# Patient Record
Sex: Female | Born: 1965 | Hispanic: Yes | Marital: Married | Smoking: Never smoker
Health system: Southern US, Community
[De-identification: ages and names within clinical notes are randomized; demographics above are authoritative.]

---

## 1999-11-29 ENCOUNTER — Encounter: Payer: Self-pay | Admitting: *Deleted

## 1999-11-29 ENCOUNTER — Ambulatory Visit (HOSPITAL_COMMUNITY): Admission: RE | Admit: 1999-11-29 | Discharge: 1999-11-29 | Payer: Self-pay | Admitting: *Deleted

## 2001-02-12 ENCOUNTER — Encounter: Payer: Self-pay | Admitting: *Deleted

## 2001-02-12 ENCOUNTER — Ambulatory Visit (HOSPITAL_COMMUNITY): Admission: RE | Admit: 2001-02-12 | Discharge: 2001-02-12 | Payer: Self-pay | Admitting: *Deleted

## 2002-02-02 ENCOUNTER — Other Ambulatory Visit: Admission: RE | Admit: 2002-02-02 | Discharge: 2002-02-02 | Payer: Self-pay | Admitting: *Deleted

## 2002-02-18 ENCOUNTER — Encounter: Payer: Self-pay | Admitting: Obstetrics and Gynecology

## 2002-02-18 ENCOUNTER — Ambulatory Visit (HOSPITAL_COMMUNITY): Admission: RE | Admit: 2002-02-18 | Discharge: 2002-02-18 | Payer: Self-pay | Admitting: Obstetrics and Gynecology

## 2002-02-21 ENCOUNTER — Encounter: Admission: RE | Admit: 2002-02-21 | Discharge: 2002-02-21 | Payer: Self-pay | Admitting: Obstetrics and Gynecology

## 2002-02-21 ENCOUNTER — Encounter: Payer: Self-pay | Admitting: Obstetrics and Gynecology

## 2003-03-01 ENCOUNTER — Encounter: Admission: RE | Admit: 2003-03-01 | Discharge: 2003-03-01 | Payer: Self-pay | Admitting: Obstetrics and Gynecology

## 2003-03-01 ENCOUNTER — Encounter: Payer: Self-pay | Admitting: Obstetrics and Gynecology

## 2003-12-22 ENCOUNTER — Other Ambulatory Visit: Admission: RE | Admit: 2003-12-22 | Discharge: 2003-12-22 | Payer: Self-pay | Admitting: Obstetrics & Gynecology

## 2004-07-18 ENCOUNTER — Encounter: Admission: RE | Admit: 2004-07-18 | Discharge: 2004-07-18 | Payer: Self-pay | Admitting: Obstetrics and Gynecology

## 2005-09-03 ENCOUNTER — Encounter: Admission: RE | Admit: 2005-09-03 | Discharge: 2005-09-03 | Payer: Self-pay | Admitting: Obstetrics and Gynecology

## 2006-09-09 ENCOUNTER — Encounter: Admission: RE | Admit: 2006-09-09 | Discharge: 2006-09-09 | Payer: Self-pay | Admitting: Obstetrics and Gynecology

## 2007-09-13 ENCOUNTER — Encounter: Admission: RE | Admit: 2007-09-13 | Discharge: 2007-09-13 | Payer: Self-pay | Admitting: Obstetrics and Gynecology

## 2008-09-26 ENCOUNTER — Encounter: Admission: RE | Admit: 2008-09-26 | Discharge: 2008-09-26 | Payer: Self-pay | Admitting: Obstetrics and Gynecology

## 2009-09-27 ENCOUNTER — Encounter: Admission: RE | Admit: 2009-09-27 | Discharge: 2009-09-27 | Payer: Self-pay | Admitting: Obstetrics and Gynecology

## 2010-10-08 ENCOUNTER — Encounter: Admission: RE | Admit: 2010-10-08 | Discharge: 2010-10-08 | Payer: Self-pay | Admitting: Obstetrics and Gynecology

## 2015-07-09 ENCOUNTER — Ambulatory Visit
Admission: RE | Admit: 2015-07-09 | Discharge: 2015-07-09 | Disposition: A | Discharge status: Home / Self Care | Payer: PRIVATE HEALTH INSURANCE | Source: Ambulatory Visit | Attending: Infectious Disease | Admitting: Infectious Disease

## 2015-07-09 ENCOUNTER — Other Ambulatory Visit: Payer: Self-pay | Admitting: Infectious Disease

## 2015-07-09 DIAGNOSIS — R7611 Nonspecific reaction to tuberculin skin test without active tuberculosis: Secondary | ICD-10-CM

## 2015-07-19 ENCOUNTER — Ambulatory Visit
Admission: RE | Admit: 2015-07-19 | Discharge: 2015-07-19 | Disposition: A | Discharge status: Home / Self Care | Payer: No Typology Code available for payment source | Source: Ambulatory Visit | Attending: Infectious Disease | Admitting: Infectious Disease

## 2015-07-19 ENCOUNTER — Other Ambulatory Visit: Payer: Self-pay | Admitting: Infectious Disease

## 2015-07-19 DIAGNOSIS — Z111 Encounter for screening for respiratory tuberculosis: Secondary | ICD-10-CM

## 2015-07-23 ENCOUNTER — Other Ambulatory Visit: Payer: Self-pay | Admitting: Infectious Disease

## 2015-07-23 DIAGNOSIS — A159 Respiratory tuberculosis unspecified: Secondary | ICD-10-CM

## 2015-07-25 ENCOUNTER — Ambulatory Visit
Admission: RE | Admit: 2015-07-25 | Discharge: 2015-07-25 | Disposition: A | Discharge status: Home / Self Care | Payer: BLUE CROSS/BLUE SHIELD | Source: Ambulatory Visit | Attending: Infectious Disease | Admitting: Infectious Disease

## 2015-07-25 DIAGNOSIS — A159 Respiratory tuberculosis unspecified: Secondary | ICD-10-CM

## 2015-07-25 MED ORDER — IOPAMIDOL (ISOVUE-300) INJECTION 61%: 86.0000 mL | Freq: Once | INTRAVENOUS | Status: DC | PRN

## 2015-07-26 ENCOUNTER — Inpatient Hospital Stay: Admission: RE | Admit: 2015-07-26 | Payer: Self-pay | Source: Ambulatory Visit

## 2017-01-18 IMAGING — CR DG CHEST 1V
1 series · 1 of 1 positions shown · non-contrast
Comparison: 06/28/2015.

CLINICAL DATA: Positive PPD.

EXAM:
CHEST  1 VIEW

[w chest pa]
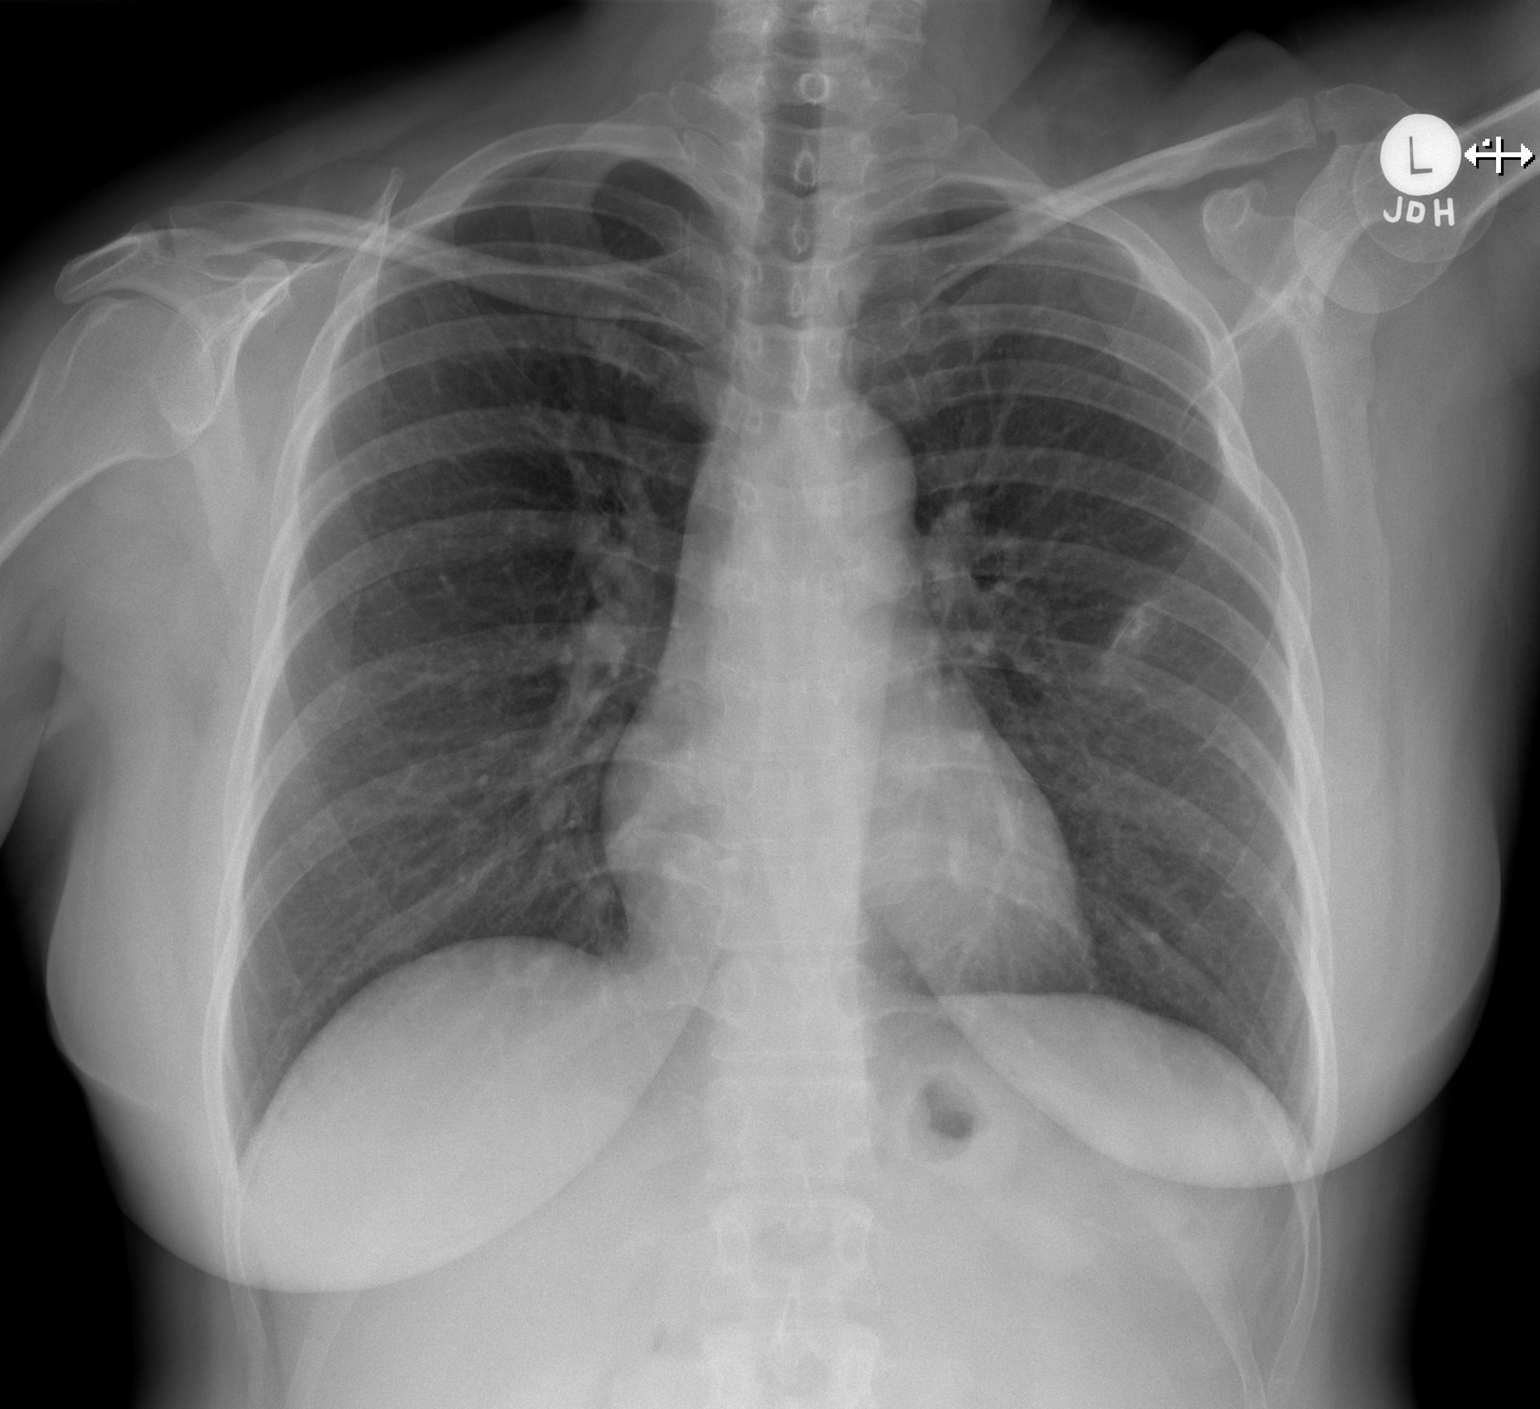

[1 of 1 positions shown; findings below may reference images not displayed]

FINDINGS: Mediastinum and hilar structures normal. Persistent ill-defined
density noted left mid lung. No significant clearing. This could
represent persistent infection including granulomatous infection,
pleural parenchymal scarring, or pulmonary mass lesion.
Contrast-enhanced chest CT is suggested to further evaluate. No
pleural effusion or pneumothorax.
IMPRESSION: Persistent density in the left mid lung field. This could represent
persistent infection including granulomatous infection,
pleural-parenchymal scarring, or pulmonary mass lesion.
Contrast-enhanced chest CT is suggested for further evaluation.

## 2017-10-28 DIAGNOSIS — Z01419 Encounter for gynecological examination (general) (routine) without abnormal findings: Secondary | ICD-10-CM | POA: Diagnosis not present

## 2017-10-28 DIAGNOSIS — Z1151 Encounter for screening for human papillomavirus (HPV): Secondary | ICD-10-CM | POA: Diagnosis not present

## 2017-10-28 DIAGNOSIS — Z1231 Encounter for screening mammogram for malignant neoplasm of breast: Secondary | ICD-10-CM | POA: Diagnosis not present

## 2017-10-28 DIAGNOSIS — Z6828 Body mass index (BMI) 28.0-28.9, adult: Secondary | ICD-10-CM | POA: Diagnosis not present

## 2018-05-04 DIAGNOSIS — Z0000 Encounter for general adult medical examination without abnormal findings: Secondary | ICD-10-CM | POA: Diagnosis not present

## 2018-05-04 DIAGNOSIS — E7800 Pure hypercholesterolemia, unspecified: Secondary | ICD-10-CM | POA: Diagnosis not present

## 2018-08-10 DIAGNOSIS — Z1211 Encounter for screening for malignant neoplasm of colon: Secondary | ICD-10-CM | POA: Diagnosis not present

## 2018-08-18 DIAGNOSIS — E7800 Pure hypercholesterolemia, unspecified: Secondary | ICD-10-CM | POA: Diagnosis not present

## 2018-09-06 DIAGNOSIS — Z23 Encounter for immunization: Secondary | ICD-10-CM | POA: Diagnosis not present

## 2018-12-29 DIAGNOSIS — L438 Other lichen planus: Secondary | ICD-10-CM | POA: Diagnosis not present

## 2018-12-29 DIAGNOSIS — L821 Other seborrheic keratosis: Secondary | ICD-10-CM | POA: Diagnosis not present

## 2019-01-11 DIAGNOSIS — Z6827 Body mass index (BMI) 27.0-27.9, adult: Secondary | ICD-10-CM | POA: Diagnosis not present

## 2019-01-11 DIAGNOSIS — Z01419 Encounter for gynecological examination (general) (routine) without abnormal findings: Secondary | ICD-10-CM | POA: Diagnosis not present

## 2019-01-11 DIAGNOSIS — Z1231 Encounter for screening mammogram for malignant neoplasm of breast: Secondary | ICD-10-CM | POA: Diagnosis not present

## 2019-01-18 DIAGNOSIS — E7800 Pure hypercholesterolemia, unspecified: Secondary | ICD-10-CM | POA: Diagnosis not present

## 2019-06-07 DIAGNOSIS — R030 Elevated blood-pressure reading, without diagnosis of hypertension: Secondary | ICD-10-CM | POA: Diagnosis not present

## 2019-07-14 DIAGNOSIS — E7800 Pure hypercholesterolemia, unspecified: Secondary | ICD-10-CM | POA: Diagnosis not present

## 2019-07-14 DIAGNOSIS — I10 Essential (primary) hypertension: Secondary | ICD-10-CM | POA: Diagnosis not present

## 2019-08-09 ENCOUNTER — Other Ambulatory Visit: Payer: Self-pay | Admitting: Registered"

## 2019-08-09 DIAGNOSIS — Z20822 Contact with and (suspected) exposure to covid-19: Secondary | ICD-10-CM

## 2019-08-09 DIAGNOSIS — R6889 Other general symptoms and signs: Secondary | ICD-10-CM | POA: Diagnosis not present

## 2019-08-11 LAB — NOVEL CORONAVIRUS, NAA: SARS-CoV-2, NAA: NOT DETECTED

## 2019-11-01 DIAGNOSIS — Z23 Encounter for immunization: Secondary | ICD-10-CM | POA: Diagnosis not present

## 2020-03-22 ENCOUNTER — Other Ambulatory Visit: Payer: Self-pay | Admitting: Obstetrics and Gynecology

## 2020-03-22 DIAGNOSIS — E2839 Other primary ovarian failure: Secondary | ICD-10-CM

## 2020-05-08 ENCOUNTER — Ambulatory Visit
Admission: RE | Admit: 2020-05-08 | Discharge: 2020-05-08 | Disposition: A | Discharge status: Home / Self Care | Payer: No Typology Code available for payment source | Source: Ambulatory Visit | Attending: Obstetrics and Gynecology | Admitting: Obstetrics and Gynecology

## 2020-05-08 ENCOUNTER — Other Ambulatory Visit: Payer: Self-pay

## 2020-05-08 DIAGNOSIS — E2839 Other primary ovarian failure: Secondary | ICD-10-CM

## 2020-10-31 ENCOUNTER — Ambulatory Visit: Payer: No Typology Code available for payment source

## 2022-08-28 ENCOUNTER — Ambulatory Visit
Admission: RE | Admit: 2022-08-28 | Discharge: 2022-08-28 | Disposition: A | Discharge status: Home / Self Care | Payer: Self-pay | Source: Ambulatory Visit | Attending: Neurological Surgery | Admitting: Neurological Surgery

## 2022-08-28 ENCOUNTER — Other Ambulatory Visit: Payer: Self-pay | Admitting: Radiation Therapy

## 2022-08-28 DIAGNOSIS — D329 Benign neoplasm of meninges, unspecified: Secondary | ICD-10-CM

## 2022-09-01 ENCOUNTER — Other Ambulatory Visit: Payer: Self-pay | Admitting: Radiation Therapy

## 2022-09-01 ENCOUNTER — Inpatient Hospital Stay: Payer: No Typology Code available for payment source | Attending: Neurological Surgery

## 2022-09-01 DIAGNOSIS — D329 Benign neoplasm of meninges, unspecified: Secondary | ICD-10-CM

## 2022-09-02 NOTE — Progress Notes (Addendum)
Location/Histology of Brain Tumor:  MRI Head w/ & w/o Contrast 01/30/2022 --IMPRESSION:  No definite acute intracranial abnormality appreciated.  There is a small homogeneously enhancing extra-axial dural based mass abutting the posterior aspect of the cavernous sinus on the right (measuring approximately 11 x 12 mm). No significant mass effect on the adjacent structures. Imaging appearance and location certainly suggestive of a meningioma, although, a malignant process unfortunately cannot be excluded. Recommend neurosurgical consultation   Patient presented with symptoms of:  sudden and intense headache this past February.   Past or anticipated interventions, if any, per neurosurgery:  08/08/2022 --Dr. Emelda Brothers (office visit)   Past or anticipated interventions, if any, per medical oncology:  No referral at this time  Dose of Decadron, if applicable: Not currently prescribed  Recent neurologic symptoms, if any:  Seizures: None Headaches: Reports occasional; generally towards the front of her head; occasionally takes OTC pain medication if swimmy head sensation lingers Nausea:  Denies Dizziness/ataxia Denies Difficulty with hand coordination: Denies  Focal numbness/weakness: Denies any new concerns (mild numbness to her right index finger from a previous cut, and occasional numbness to her thumbs from arthritis) Visual deficits/changes: Denies Confusion/Memory deficits: Denies  SAFETY ISSUES: Prior radiation? No Pacemaker/ICD? No Possible current pregnancy? No--perimenopausal and on oral birth control Is the patient on methotrexate? No  Additional Complaints / other details: 3T-MRI scheduled for 09/10/2022

## 2022-09-04 NOTE — Progress Notes (Signed)
Radiation Oncology         [phone] ________________________________  Initial Outpatient Consultation  Name: Janice Burns MRN: [id number]  Date: 09/05/2022  DOB: Oct 03, 1966  XQ:JJHERDEY, L.Marlou Sa, MD  Judith Part, MD   REFERRING PHYSICIAN: Judith Part, MD  DIAGNOSIS:    ICD-10-CM   1. Meningioma, cerebral (HCC)  D32.0 diazepam (VALIUM) 5 MG tablet    LORazepam (ATIVAN) 1 MG tablet    2. Benign neoplasm of cerebral meninges (HCC)  D32.0       Right sided cavernous sinus meningioma     CHIEF COMPLAINT: Here to discuss management of meningioma  HISTORY OF PRESENT ILLNESS::Janice Burns is a 56 y.o. female who presents today for consideration of radiosurgery in management of her recently diagnosed brain mass / meningioma.   The patient presented earlier this year for an MRI of the brain on 01/30/22 for evaluation of headaches. MRI revealed a small homogeneously enhancing extra-axial dural based mass abutting the posterior aspect of the cavernous sinus on the right, measuring approximately 11 x 12 mm. No significant mass effect on the adjacent structures was appreciated. Overall, the appearance and location of the mass were suggestive of a meningioma, although, a malignant process could not be entirely excluded.   Follow-up MRI of the brain on 08/12/22 showed a slight increase in size of the right clinoidal enhancing mass consistent with meningioma, measuring approximately 12 x 12 x 13 mm.   Accordingly, the patient followed up with Dr. Zada Finders on 08/28/22 to discuss MRI findings. Given a slight increase in size and the tumor's location, Dr. Zada Finders recommends treating the mass with radiosurgery, which we will discuss in detail today.      Recent neurologic symptoms, if any:  Seizures: None Headaches: Reports occasional; generally towards the front of her head; occasionally takes OTC pain medication if swimmy head sensation lingers Nausea:   Denies Dizziness/ataxia Denies Difficulty with hand coordination: Denies  Focal numbness/weakness: Denies any new concerns (mild numbness to her right index finger from a previous cut, and occasional numbness to her thumbs from arthritis) Visual deficits/changes: Denies Confusion/Memory deficits: [place]   Here with her husband and he appears to be very supportive.  She denies prior radiation.   She denies current pregnancy.     PREVIOUS RADIATION THERAPY: No  PAST MEDICAL HISTORY:  has no past medical history on file.    PAST SURGICAL HISTORY:History reviewed. No pertinent surgical history.  FAMILY HISTORY: family history includes Vaginal cancer in her maternal grandmother.  SOCIAL HISTORY:  reports that she has never smoked. She has never used smokeless tobacco. She reports current alcohol use.  ALLERGIES: Patient has no known allergies.  MEDICATIONS:  Current Outpatient Medications  Medication Sig Dispense Refill   atorvastatin (LIPITOR) 20 MG tablet Take 20 mg by mouth daily.     diazepam (VALIUM) 5 MG tablet Take 1 tablet 60 minutes prior to MRI; if needed, take a second tablet 30 minutes prior to MRI for claustrophobia. 4 tablet 0   estradiol-norethindrone (ACTIVELLA) 1-0.5 MG tablet Take 1 tablet by mouth daily.     LORazepam (ATIVAN) 1 MG tablet Take 1 tablet 40 minutes prior to radiation oncology procedures; if needed, take a second tablet 20 minutes prior for anxiety/claustrophobia. 8 tablet 0   metoprolol succinate (TOPROL-XL) 50 MG 24 hr tablet Take 50 mg by mouth daily.     No current facility-administered medications for this encounter.    REVIEW OF SYSTEMS:  Notable for that above.  PHYSICAL EXAM:  weight is 160 lb 8 oz (72.8 kg). Her oral temperature is 98.5 F (36.9 C). Her blood pressure is 147/84 (abnormal) and her pulse is 67. Her respiration is 16 and oxygen saturation is 100%.   General: Alert and oriented, in no acute distress  HEENT: Head is  normocephalic. Extraocular movements are intact. Oropharynx is clear.  Visual quadrants are intact Heart: Regular in rate and rhythm with no murmurs, rubs, or gallops. Chest: Clear to auscultation bilaterally, with no rhonchi, wheezes, or rales. Extremities: No cyanosis or edema. Lymphatics: see Neck Exam Skin: No concerning lesions. Musculoskeletal: symmetric strength and muscle tone throughout. Neurologic: Cranial nerves II through XII are grossly intact. No obvious focalities. Speech is fluent. Coordination is intact. Psychiatric: Judgment and insight are intact. Affect is appropriate.  KPS = 100  100 - Normal; no complaints; no evidence of disease. 90   - Able to carry on normal activity; minor signs or symptoms of disease. 80   - Normal activity with effort; some signs or symptoms of disease. 28   - Cares for self; unable to carry on normal activity or to do active work. 60   - Requires occasional assistance, but is able to care for most of his personal needs. 50   - Requires considerable assistance and frequent medical care. 24   - Disabled; requires special care and assistance. 53   - Severely disabled; hospital admission is indicated although death not imminent. 75   - Very sick; hospital admission necessary; active supportive treatment necessary. 10   - Moribund; fatal processes progressing rapidly. 0     - Dead  Karnofsky DA, Abelmann Paulina, Craver LS and Burchenal JH [phone]) The use of the nitrogen mustards in the palliative treatment of carcinoma: with particular reference to bronchogenic carcinoma Cancer 1 634-56    LABORATORY DATA:  No results found for: "WBC", "HGB", "HCT", "MCV", "PLT" CMP     Component Value Date/Time   BUN 14 09/05/2022 0858   CREATININE 0.69 09/05/2022 0858   GFRNONAA >60 09/05/2022 0858         RADIOGRAPHY: I personally reviewed her imaging as above      IMPRESSION/PLAN: This is a  lovely 56 year old woman with a mass in her brain consistent  with a benign meningioma; she has seen neurosurgery and surgical resection is not recommended.  We discussed her at our brain tumor conference and the consensus was that she should see me for an opinion regarding radiosurgery for putative benign meningioma.  Given the location of the meningioma and the slight growth, I recommend radiosurgery to prevent it from continuing to grow and to protect her neurologic function.  I recommend that she  be treated with fractionated radiosurgery to minimize the risk of injury to cranial nerves/optic chiasm and surrounding normal tissues.  Recommended dose of 25 Gray in 5 fractions.  We did discuss single fraction radiation therapy which is slightly more convenient but may have slightly higher risks.  She would like to pursue the 5 fraction regimen.  She will undergo treatment planning in the near future as well as a 3 Tesla MRI.  We will start her radiation before the end of the month and finish her radiation by the beginning of November  The risk benefits and side effects of radiation therapy in the form of radiosurgery were discussed in detail.  No guarantees of treatment were given.  Side effects may include but not necessarily be limited to headache, dizziness, seizure,  nausea, injury to brain, injury to cranial nerves, vision loss.  Consent form was signed and placed in her chart.  She is enthusiastic about proceeding with treatment.  Benzodiazepine prescriptions provided as she is very claustrophobic; instructions given on how to take these medications for MRIs and radiation procedures (which will require a stereotactic mask).  On date of service, in total, I spent 60 minutes on this encounter. Patient was seen in person.   __________________________________________   Eppie Gibson, MD  This document serves as a record of services personally performed by Eppie Gibson, MD. It was created on her behalf by Roney Mans, a trained medical scribe. The creation  of this record is based on the scribe's personal observations and the provider's statements to them. This document has been checked and approved by the attending provider.

## 2022-09-05 ENCOUNTER — Ambulatory Visit
Admission: RE | Admit: 2022-09-05 | Discharge: 2022-09-05 | Disposition: A | Discharge status: Home / Self Care | Payer: No Typology Code available for payment source | Source: Ambulatory Visit | Attending: Radiation Oncology | Admitting: Radiation Oncology

## 2022-09-05 ENCOUNTER — Other Ambulatory Visit: Payer: Self-pay

## 2022-09-05 ENCOUNTER — Encounter: Payer: Self-pay | Admitting: Radiation Oncology

## 2022-09-05 ENCOUNTER — Telehealth: Payer: Self-pay | Admitting: Radiation Therapy

## 2022-09-05 VITALS — BP 147/84 | HR 67 | Temp 98.5°F | Resp 16 | Wt 160.5 lb

## 2022-09-05 DIAGNOSIS — D320 Benign neoplasm of cerebral meninges: Secondary | ICD-10-CM | POA: Insufficient documentation

## 2022-09-05 DIAGNOSIS — Z79899 Other long term (current) drug therapy: Secondary | ICD-10-CM | POA: Insufficient documentation

## 2022-09-05 DIAGNOSIS — D329 Benign neoplasm of meninges, unspecified: Secondary | ICD-10-CM

## 2022-09-05 DIAGNOSIS — Z808 Family history of malignant neoplasm of other organs or systems: Secondary | ICD-10-CM | POA: Insufficient documentation

## 2022-09-05 LAB — BUN & CREATININE (CHCC)
BUN: 14 mg/dL (ref 6–20)
Creatinine: 0.69 mg/dL (ref 0.44–1.00)
GFR, Estimated: >60 mL/min (ref >60)

## 2022-09-05 MED ORDER — LORAZEPAM 1 MG PO TABS: ORAL_TABLET | ORAL | 0 refills | Status: DC

## 2022-09-05 MED ORDER — DIAZEPAM 5 MG PO TABS: ORAL_TABLET | ORAL | 0 refills | Status: AC

## 2022-09-05 NOTE — Telephone Encounter (Signed)
Called pt to let her know we were planning to do a urine pregnancy test the day of sim, but pt confirmed she is postmenopausal, last period at the age of 65.   Mont Dutton R.T.(R)(T) Radiation Special Procedures Navigator

## 2022-09-10 ENCOUNTER — Ambulatory Visit
Admission: RE | Admit: 2022-09-10 | Discharge: 2022-09-10 | Disposition: A | Discharge status: Home / Self Care | Payer: No Typology Code available for payment source | Source: Ambulatory Visit | Attending: Radiation Oncology | Admitting: Radiation Oncology

## 2022-09-10 DIAGNOSIS — D329 Benign neoplasm of meninges, unspecified: Secondary | ICD-10-CM

## 2022-09-10 MED ORDER — GADOPICLENOL 0.5 MMOL/ML IV SOLN
7.5000 mL | Freq: Once | INTRAVENOUS | Status: AC | PRN
  Administered 2022-09-10: 7.5 mL via INTRAVENOUS

## 2022-09-12 ENCOUNTER — Ambulatory Visit: Payer: No Typology Code available for payment source | Admitting: Radiation Oncology

## 2022-09-12 ENCOUNTER — Other Ambulatory Visit: Payer: Self-pay | Admitting: Radiation Therapy

## 2022-09-12 ENCOUNTER — Ambulatory Visit: Payer: No Typology Code available for payment source

## 2022-09-15 ENCOUNTER — Ambulatory Visit
Admission: RE | Admit: 2022-09-15 | Discharge: 2022-09-15 | Disposition: A | Discharge status: Home / Self Care | Payer: No Typology Code available for payment source | Source: Ambulatory Visit | Attending: Radiation Oncology | Admitting: Radiation Oncology

## 2022-09-15 ENCOUNTER — Other Ambulatory Visit: Payer: Self-pay

## 2022-09-15 VITALS — BP 147/87 | HR 76 | Temp 97.9°F | Resp 18 | Ht 64.0 in | Wt 161.8 lb

## 2022-09-15 DIAGNOSIS — D320 Benign neoplasm of cerebral meninges: Secondary | ICD-10-CM | POA: Diagnosis not present

## 2022-09-15 MED ORDER — SODIUM CHLORIDE 0.9% FLUSH: 10.0000 mL | INTRAVENOUS | Status: DC | PRN

## 2022-09-15 MED ORDER — SODIUM CHLORIDE 0.9% FLUSH
10.0000 mL | INTRAVENOUS | Status: DC | PRN
  Administered 2022-09-15: 10 mL via INTRAVENOUS

## 2022-09-17 ENCOUNTER — Ambulatory Visit: Payer: No Typology Code available for payment source | Admitting: Radiation Oncology

## 2022-09-17 DIAGNOSIS — D320 Benign neoplasm of cerebral meninges: Secondary | ICD-10-CM | POA: Diagnosis not present

## 2022-09-22 ENCOUNTER — Ambulatory Visit
Admission: RE | Admit: 2022-09-22 | Discharge: 2022-09-22 | Disposition: A | Discharge status: Home / Self Care | Payer: No Typology Code available for payment source | Source: Ambulatory Visit | Attending: Radiation Oncology | Admitting: Radiation Oncology

## 2022-09-22 ENCOUNTER — Other Ambulatory Visit: Payer: Self-pay

## 2022-09-22 DIAGNOSIS — D320 Benign neoplasm of cerebral meninges: Secondary | ICD-10-CM | POA: Diagnosis not present

## 2022-09-22 LAB — RAD ONC ARIA SESSION SUMMARY
Course Elapsed Days: 0
Plan Fractions Treated to Date: 1
Plan Prescribed Dose Per Fraction: 5 Gy
Plan Total Fractions Prescribed: 5
Plan Total Prescribed Dose: 25 Gy
Reference Point Dosage Given to Date: 5 Gy
Reference Point Session Dosage Given: 5 Gy
Session Number: 1

## 2022-09-22 NOTE — Progress Notes (Signed)
Patient to nursing for observation [facility].  Denies headache, visual changes, ringing in ears, nausea, and skin irritation.  Report feeling sleepy.  Speech clear.  Ambulated out of clinic independently hand holding with spouse.  Advised not to do anything strenuous for 24 hours and to call [phone] if have questions.  Vitals:  98.1-65-114/60 O2 sat 98% room air.

## 2022-09-22 NOTE — Op Note (Signed)
  Name: Janice Burns  MRN: [id number]  Date: 09/22/2022   DOB: 02-May-1966  Stereotactic Radiosurgery Operative Note  PRE-OPERATIVE DIAGNOSIS:  Cavernous sinus meningioma  POST-OPERATIVE DIAGNOSIS:  Same  PROCEDURE:  Stereotactic Radiosurgery  SURGEON:  Judith Part, MD  NARRATIVE: The patient underwent a radiation treatment planning session in the radiation oncology simulation suite under the care of the radiation oncology physician and physicist.  I participated closely in the radiation treatment planning afterwards. The patient underwent planning CT which was fused to 3T high resolution MRI with 1 mm axial slices.  These images were fused on the planning system.  We contoured the gross target volumes and subsequently expanded this to yield the Planning Target Volume. I actively participated in the planning process.  I helped to define and review the target contours and also the contours of the optic pathway, eyes, brainstem and selected nearby organs at risk.  All the dose constraints for critical structures were reviewed and compared to AAPM Task Group 101.  The prescription dose conformity was reviewed.  I approved the plan electronically.    Accordingly, Janice Burns was brought to the TrueBeam stereotactic radiation treatment linac and placed in the custom immobilization mask.  The patient was aligned according to the IR fiducial markers with BrainLab Exactrac, then orthogonal x-rays were used in ExacTrac with the 6DOF robotic table and the shifts were made to align the patient  Janice Burns received stereotactic radiosurgery uneventfully.    Lesions treated:  1   Complex lesions treated:  1 (>3.5 cm, <32m of optic path, or within the brainstem)   The detailed description of the procedure is recorded in the radiation oncology procedure note.  I was present for the duration of the procedure.  DISPOSITION:  Following delivery, the patient was transported to nursing in stable condition  and monitored for possible acute effects to be discharged to home in stable condition with follow-up in one month.  TJudith Part MD 09/22/2022 5:17 PM

## 2022-09-24 ENCOUNTER — Other Ambulatory Visit: Payer: Self-pay

## 2022-09-24 ENCOUNTER — Ambulatory Visit
Admission: RE | Admit: 2022-09-24 | Discharge: 2022-09-24 | Disposition: A | Discharge status: Home / Self Care | Payer: No Typology Code available for payment source | Source: Ambulatory Visit | Attending: Radiation Oncology | Admitting: Radiation Oncology

## 2022-09-24 DIAGNOSIS — D320 Benign neoplasm of cerebral meninges: Secondary | ICD-10-CM | POA: Diagnosis not present

## 2022-09-24 LAB — RAD ONC ARIA SESSION SUMMARY
Course Elapsed Days: 2
Plan Fractions Treated to Date: 2
Plan Prescribed Dose Per Fraction: 5 Gy
Plan Total Fractions Prescribed: 5
Plan Total Prescribed Dose: 25 Gy
Reference Point Dosage Given to Date: 10 Gy
Reference Point Session Dosage Given: 5 Gy
Session Number: 2

## 2022-09-24 NOTE — Progress Notes (Signed)
Nurse monitoring complete status post 2 of 5 SRS treatments. Patient without complaints. Patient denies new or worsening neurologic symptoms. Vitals stable. Instructed patient to avoid strenuous activity for the next 24 hours. Instructed patient to call [phone] with needs related to treatment after hours or over the weekend. Patient verbalized understanding. Patient ambulated out of clinic unassisted accompanied by her husband without incident  Vitals:   09/24/22 1504  BP: (!) 147/81  Pulse: 65  Resp: 18  Temp: 97.7 F (36.5 C)  SpO2: 100%

## 2022-09-26 ENCOUNTER — Other Ambulatory Visit: Payer: Self-pay

## 2022-09-26 ENCOUNTER — Ambulatory Visit
Admission: RE | Admit: 2022-09-26 | Discharge: 2022-09-26 | Disposition: A | Discharge status: Home / Self Care | Payer: No Typology Code available for payment source | Source: Ambulatory Visit | Attending: Radiation Oncology | Admitting: Radiation Oncology

## 2022-09-26 DIAGNOSIS — D320 Benign neoplasm of cerebral meninges: Secondary | ICD-10-CM | POA: Diagnosis not present

## 2022-09-26 LAB — RAD ONC ARIA SESSION SUMMARY
Course Elapsed Days: 4
Plan Fractions Treated to Date: 3
Plan Prescribed Dose Per Fraction: 5 Gy
Plan Total Fractions Prescribed: 5
Plan Total Prescribed Dose: 25 Gy
Reference Point Dosage Given to Date: 15 Gy
Reference Point Session Dosage Given: 5 Gy
Session Number: 3

## 2022-09-26 NOTE — Progress Notes (Signed)
Nurse monitoring complete status post SRS treatments. Pt was monitored for 15 minutes.  Patient without complaints. Patient denies new or worsening neurologic symptoms. Vitals stable. Instructed patient to avoid strenuous activity for the next 24 hours.. Instructed patient to call [phone] with needs related to treatment after hours or over the weekend. Patient verbalized understanding  Vitals:   09/26/22 1524  BP: 122/75  Pulse: 68  Resp: 18  Temp: (!) 97.5 F (36.4 C)  SpO2: 100%   Pt ambulated out of clinic without difficulty. Pt stable at time of leaving radiation oncology department.

## 2022-09-29 ENCOUNTER — Other Ambulatory Visit: Payer: Self-pay

## 2022-09-29 ENCOUNTER — Ambulatory Visit
Admission: RE | Admit: 2022-09-29 | Discharge: 2022-09-29 | Disposition: A | Discharge status: Home / Self Care | Payer: No Typology Code available for payment source | Source: Ambulatory Visit | Attending: Radiation Oncology | Admitting: Radiation Oncology

## 2022-09-29 VITALS — BP 143/87 | HR 69 | Temp 98.1°F | Resp 18

## 2022-09-29 DIAGNOSIS — D320 Benign neoplasm of cerebral meninges: Secondary | ICD-10-CM | POA: Diagnosis not present

## 2022-09-29 LAB — RAD ONC ARIA SESSION SUMMARY
Course Elapsed Days: 7
Plan Fractions Treated to Date: 4
Plan Prescribed Dose Per Fraction: 5 Gy
Plan Total Fractions Prescribed: 5
Plan Total Prescribed Dose: 25 Gy
Reference Point Dosage Given to Date: 20 Gy
Reference Point Session Dosage Given: 5 Gy
Session Number: 4

## 2022-09-29 NOTE — Progress Notes (Addendum)
Nurse monitoring complete status post SRS treatments. Patient without complaints. Patient denies new or worsening neurologic symptoms. Vitals stable. Instructed patient to avoid strenuous activity for the next 24 hours.  Instructed patient to call [phone] with needs related to treatment after hours or over the weekend. Patient verbalized understanding   Janice Burns rested with [place] for 15 minutes following SRS treatment.  Patient denies headache, dizziness, nausea, diplopia or ringing in the ears. Denies fatigue. Patient without complaints. Understands to avoid strenuous activity for the next 24 hours and call [phone] with needs.   Vitals:   09/29/22 1529  BP: (!) 143/87  Pulse: 69  Resp: 18  Temp: 98.1 F (36.7 C)  SpO2: 100%    Pt ambulated out of clinic without complication. Pt stable at time of leaving clinic.

## 2022-10-01 ENCOUNTER — Encounter: Payer: Self-pay | Admitting: Radiation Oncology

## 2022-10-01 ENCOUNTER — Ambulatory Visit
Admission: RE | Admit: 2022-10-01 | Discharge: 2022-10-01 | Disposition: A | Discharge status: Home / Self Care | Payer: No Typology Code available for payment source | Source: Ambulatory Visit | Attending: Radiation Oncology | Admitting: Radiation Oncology

## 2022-10-01 ENCOUNTER — Other Ambulatory Visit: Payer: Self-pay

## 2022-10-01 DIAGNOSIS — Z808 Family history of malignant neoplasm of other organs or systems: Secondary | ICD-10-CM | POA: Insufficient documentation

## 2022-10-01 DIAGNOSIS — D320 Benign neoplasm of cerebral meninges: Secondary | ICD-10-CM | POA: Insufficient documentation

## 2022-10-01 DIAGNOSIS — Z79899 Other long term (current) drug therapy: Secondary | ICD-10-CM | POA: Diagnosis not present

## 2022-10-01 LAB — RAD ONC ARIA SESSION SUMMARY
Course Elapsed Days: 9
Plan Fractions Treated to Date: 5
Plan Prescribed Dose Per Fraction: 5 Gy
Plan Total Fractions Prescribed: 5
Plan Total Prescribed Dose: 25 Gy
Reference Point Dosage Given to Date: 25 Gy
Reference Point Session Dosage Given: 5 Gy
Session Number: 5

## 2022-10-01 NOTE — Progress Notes (Signed)
Nurse monitoring complete status post SRS treatments. Patient without complaints. Patient denies new or worsening neurologic symptoms. Vitals stable. Instructed patient to avoid strenuous activity for the next 24 hours. Instructed patient to call [phone] with needs related to treatment after hours or over the weekend. Patient verbalized understanding Pt ambulated out of clinic without difficulty with family at bedside/  Vitals:   10/01/22 1327  BP: (!) 135/90  Pulse: 77  Resp: 18  Temp: 97.9 F (36.6 C)  SpO2: 100%

## 2022-10-21 NOTE — Progress Notes (Signed)
                                                                                                                                                            Patient Name: Janice Burns MRN: [id number] DOB: 10-06-1966 Referring Physician: Allison Quarry (Profile Not Attached) Date of Service: 10/01/2022 [facility], [place]                                                        End Of Treatment Note  Diagnoses: D32.0-Benign neoplasm of cerebral meninges Right sided cavernous sinus meningioma   Intent: Curative  Radiation Treatment Dates: 09/22/2022 through 10/01/2022 Site Technique Total Dose (Gy) Dose per Fx (Gy) Completed Fx Beam Energies  Brain: Brain_R_Clino IMRT 25/25 5 5/5 6XFFF   Narrative: The patient tolerated radiation therapy relatively well.   Plan: The patient will follow-up with radiation oncology in 44mo -----------------------------------  SEppie Gibson MD

## 2022-10-22 NOTE — Progress Notes (Signed)
Ms. Wentling is here for follow up for [facility] treatment. She completed treatment on 10-01-22.   Recent neurologic symptoms, if any:  Seizures: None Headaches: Denies Nausea: Denies Wt Readings from Last 3 Encounters:  10/31/22 160 lb 8 oz (72.8 kg)  09/15/22 161 lb 12.8 oz (73.4 kg)  09/05/22 160 lb 8 oz (72.8 kg)   Dizziness/ataxia: Denies Difficulty with hand coordination: Denies Focal numbness/weakness: Denies Visual deficits/changes: Denies Confusion/Memory deficits: Denies  Other issues of note: Denies any linger fatigue. Overall reports she feels good and is doing well

## 2022-10-31 ENCOUNTER — Other Ambulatory Visit: Payer: Self-pay

## 2022-10-31 ENCOUNTER — Encounter: Payer: Self-pay | Admitting: Radiation Oncology

## 2022-10-31 ENCOUNTER — Ambulatory Visit
Admission: RE | Admit: 2022-10-31 | Discharge: 2022-10-31 | Disposition: A | Discharge status: Home / Self Care | Payer: No Typology Code available for payment source | Source: Ambulatory Visit | Attending: Radiation Oncology | Admitting: Radiation Oncology

## 2022-10-31 VITALS — BP 137/81 | HR 78 | Temp 97.9°F | Resp 18 | Ht 64.0 in | Wt 160.5 lb

## 2022-10-31 DIAGNOSIS — Z923 Personal history of irradiation: Secondary | ICD-10-CM | POA: Diagnosis not present

## 2022-10-31 DIAGNOSIS — D320 Benign neoplasm of cerebral meninges: Secondary | ICD-10-CM

## 2022-10-31 DIAGNOSIS — Z79899 Other long term (current) drug therapy: Secondary | ICD-10-CM | POA: Insufficient documentation

## 2022-10-31 DIAGNOSIS — C320 Malignant neoplasm of glottis: Secondary | ICD-10-CM | POA: Insufficient documentation

## 2022-10-31 NOTE — Progress Notes (Signed)
Radiation Oncology         [phone] ________________________________  Name: Janice Burns MRN: [id number]  Date: 10/31/2022  DOB: Nov 08, 1966  Follow-Up Visit Note  Outpatient  CC: Alroy Dust, L.Marlou Sa, MD  Alroy Dust, L.Marlou Sa, MD  Diagnosis and Prior Radiotherapy:    ICD-10-CM   1. Benign neoplasm of cerebral meninges (HCC)  D32.0       CHIEF COMPLAINT: Here for follow-up and surveillance of meningioma   Narrative:  The patient returns today for routine follow-up.  Janice Burns is here for follow up for [facility] treatment. She completed treatment on 10-01-22.   Recent neurologic symptoms, if any:  Seizures: None Headaches: Denies Nausea: Denies Wt Readings from Last 3 Encounters:  10/31/22 160 lb 8 oz (72.8 kg)  09/15/22 161 lb 12.8 oz (73.4 kg)  09/05/22 160 lb 8 oz (72.8 kg)   Dizziness/ataxia: Denies Difficulty with hand coordination: Denies Focal numbness/weakness: Denies Visual deficits/changes: Denies Confusion/Memory deficits: Denies  Other issues of note: Denies any linger fatigue. Overall reports she feels good and is doing well                              ALLERGIES:  has No Known Allergies.  Meds: Current Outpatient Medications  Medication Sig Dispense Refill   atorvastatin (LIPITOR) 20 MG tablet Take 20 mg by mouth daily.     diazepam (VALIUM) 5 MG tablet Take 1 tablet 60 minutes prior to MRI; if needed, take a second tablet 30 minutes prior to MRI for claustrophobia. 4 tablet 0   estradiol-norethindrone (ACTIVELLA) 1-0.5 MG tablet Take 1 tablet by mouth daily.     LORazepam (ATIVAN) 1 MG tablet Take 1 tablet 40 minutes prior to radiation oncology procedures; if needed, take a second tablet 20 minutes prior for anxiety/claustrophobia. 8 tablet 0   metoprolol succinate (TOPROL-XL) 50 MG 24 hr tablet Take 50 mg by mouth daily.     No current facility-administered medications for this encounter.    Physical Findings: The patient is in no acute distress. Patient is  alert and oriented.  height is '5\' 4"'$  (1.626 m) and weight is 160 lb 8 oz (72.8 kg). Her oral temperature is 97.9 F (36.6 C). Her blood pressure is 137/81 and her pulse is 78. Her respiration is 18 and oxygen saturation is 100%. .    General: Well-appearing, ambulatory, no acute distress, alert and oriented  HEENT: Extraocular movements are intact.  Vision is grossly intact. Neurologic exam is nonfocal   KPS = 100  100 - Normal; no complaints; no evidence of disease. 90   - Able to carry on normal activity; minor signs or symptoms of disease. 80   - Normal activity with effort; some signs or symptoms of disease. 57   - Cares for self; unable to carry on normal activity or to do active work. 60   - Requires occasional assistance, but is able to care for most of his personal needs. 50   - Requires considerable assistance and frequent medical care. 60   - Disabled; requires special care and assistance. 11   - Severely disabled; hospital admission is indicated although death not imminent. 94   - Very sick; hospital admission necessary; active supportive treatment necessary. 10   - Moribund; fatal processes progressing rapidly. 0     - Dead  Karnofsky DA, Abelmann WH, Craver LS and Burchenal [facility] [phone]) The use of the nitrogen mustards in the palliative  treatment of carcinoma: with particular reference to bronchogenic carcinoma Cancer 1 634-56    Lab Findings: No results found for: "WBC", "HGB", "HCT", "MCV", "PLT"  Radiographic Findings: No results found.  Impression/Plan: She is doing well after radiosurgery for her meningioma.  We will arrange an MRI of her brain in 5 months and she will follow-up with neuro-oncology at that time for continued monitoring.  She is pleased with this plan.  On date of service, in total, I spent 20 minutes on this encounter. Patient was seen in person.  _____________________________________   Eppie Gibson, MD

## 2022-11-04 ENCOUNTER — Other Ambulatory Visit: Payer: Self-pay | Admitting: Radiation Therapy

## 2022-11-04 DIAGNOSIS — D329 Benign neoplasm of meninges, unspecified: Secondary | ICD-10-CM

## 2022-12-31 ENCOUNTER — Telehealth: Payer: Self-pay | Admitting: Radiation Therapy

## 2022-12-31 NOTE — Telephone Encounter (Signed)
I spoke with Mrs. Simao about her upcoming brain MRI and follow-up visit with Dr. Mickeal Skinner in May. She has this information recorded and plans to attend.   Mont Dutton R.T.(R)(T) Radiation Special Procedures Navigator

## 2023-03-31 ENCOUNTER — Encounter: Payer: Self-pay | Admitting: Radiation Oncology

## 2023-04-01 ENCOUNTER — Ambulatory Visit
Admission: RE | Admit: 2023-04-01 | Discharge: 2023-04-01 | Disposition: A | Discharge status: Home / Self Care | Payer: No Typology Code available for payment source | Source: Ambulatory Visit | Attending: Radiation Oncology | Admitting: Radiation Oncology

## 2023-04-01 DIAGNOSIS — D329 Benign neoplasm of meninges, unspecified: Secondary | ICD-10-CM

## 2023-04-01 MED ORDER — GADOPICLENOL 0.5 MMOL/ML IV SOLN
7.0000 mL | Freq: Once | INTRAVENOUS | Status: AC | PRN
  Administered 2023-04-01: 7 mL via INTRAVENOUS

## 2023-04-06 ENCOUNTER — Inpatient Hospital Stay: Payer: No Typology Code available for payment source | Attending: Internal Medicine | Admitting: Internal Medicine

## 2023-04-06 ENCOUNTER — Inpatient Hospital Stay: Payer: No Typology Code available for payment source

## 2023-04-06 VITALS — BP 158/79 | HR 74 | Temp 98.4°F | Resp 18 | Wt 159.3 lb

## 2023-04-06 DIAGNOSIS — Z923 Personal history of irradiation: Secondary | ICD-10-CM | POA: Insufficient documentation

## 2023-04-06 DIAGNOSIS — D320 Benign neoplasm of cerebral meninges: Secondary | ICD-10-CM | POA: Diagnosis not present

## 2023-04-06 DIAGNOSIS — D329 Benign neoplasm of meninges, unspecified: Secondary | ICD-10-CM

## 2023-04-06 MED ORDER — LORAZEPAM 1 MG PO TABS: ORAL_TABLET | ORAL | 0 refills | Status: AC

## 2023-04-06 NOTE — Progress Notes (Signed)
[facility] at [facility] [address]  [place], [place] [address] [phone]   New Patient Evaluation  Date of Service: 04/06/23 Patient Name: Janice Burns Patient MRN: [id number] Patient DOB: 10/31/1966 Provider: Henreitta Leber, MD  Identifying Statement:  Janice Burns is a 57 y.o. 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    Referring Provider: Clovis Riley, L.August Saucer, MD 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 Basilio Cairo   History of Present Illness: The patient's records from the referring physician were obtained and reviewed and the patient interviewed to confirm this HPI.  Janice Burns presents for follow up after completing radiation for meningioma in the fall.  She denies any neurologic symptoms.  No recurrence of headache which initially prompted imaging and workup.  No issues with numbness, double vision, slurred speech.  Continues to function independently.  Medications: Current Outpatient Medications on File Prior to Visit  Medication Sig Dispense Refill   atorvastatin (LIPITOR) 20 MG tablet Take 20 mg by mouth daily.     diazepam (VALIUM) 5 MG tablet Take 1 tablet 60 minutes prior to MRI; if needed, take a second tablet 30 minutes prior to MRI for claustrophobia. 4 tablet 0   estradiol-norethindrone (ACTIVELLA) 1-0.5 MG tablet Take 1 tablet by mouth daily.     LORazepam (ATIVAN) 1 MG tablet Take 1 tablet 40 minutes prior to radiation oncology procedures; if needed, take a second tablet 20 minutes prior for anxiety/claustrophobia. 8 tablet 0   metoprolol succinate (TOPROL-XL) 50 MG 24 hr tablet Take 50 mg by mouth daily.     No current facility-administered medications on file prior to visit.    Allergies: No Known Allergies Past Medical History: No past medical history on file. Past Surgical History: No past  surgical history on file. Social History:  Social History   Socioeconomic History   Marital status: Married    Spouse name: Not on file   Number of children: Not on file   Years of education: Not on file   Highest education level: Not on file  Occupational History   Not on file  Tobacco Use   Smoking status: Never   Smokeless tobacco: Never  Vaping Use   Vaping Use: Never used  Substance and Sexual Activity   Alcohol use: Yes    Comment: Occasional   Drug use: Not on file   Sexual activity: Yes    Birth control/protection: Pill  Other Topics Concern   Not on file  Social History Narrative   Not on file   Social Determinants of Health   Financial Resource Strain: Not on file  Food Insecurity: Not on file  Transportation Needs: Not on file  Physical Activity: Not on file  Stress: Not on file  Social Connections: Not on file  Intimate Partner Violence: Not on file   Family History:  Family History  Problem Relation Age of Onset   Vaginal cancer Maternal Grandmother     Review of Systems: Constitutional: Doesn't report fevers, chills or abnormal weight loss Eyes: Doesn't report blurriness of vision Ears, nose, mouth, throat, and face: Doesn't report sore throat Respiratory: Doesn't report cough, dyspnea or wheezes Cardiovascular: Doesn't report palpitation, chest discomfort  Gastrointestinal:  Doesn't report nausea, constipation, diarrhea GU: Doesn't report incontinence Skin: Doesn't report skin rashes Neurological: Per HPI Musculoskeletal: Doesn't report joint pain Behavioral/Psych: Doesn't report anxiety  Physical Exam: Vitals:   04/06/23 1203  BP: (!) 158/79  Pulse: 74  Resp: 18  Temp: 98.4 F (36.9 C)  SpO2: 99%   KPS: 90. General: Alert, cooperative, pleasant, in no acute distress Head: Normal EENT: No conjunctival injection or scleral icterus.  Lungs: Resp effort normal Cardiac: Regular rate Abdomen: Non-distended abdomen Skin: No rashes  cyanosis or petechiae. Extremities: No clubbing or edema  Neurologic Exam: Mental Status: Awake, alert, attentive to examiner. Oriented to self and environment. Language is fluent with intact comprehension.  Cranial Nerves: Visual acuity is grossly normal. Visual fields are full. Extra-ocular movements intact. No ptosis. Face is symmetric Motor: Tone and bulk are normal. Power is full in both arms and legs. Reflexes are symmetric, no pathologic reflexes present.  Sensory: Intact to light touch Gait: Normal.   Labs: I have reviewed the data as listed    Component Value Date/Time   BUN 14 09/05/2022 0858   CREATININE 0.69 09/05/2022 0858   GFRNONAA >60 09/05/2022 0858   No results found for: "WBC", "NEUTROABS", "HGB", "HCT", "MCV", "PLT"  Imaging: [facility] Clinician Interpretation: I have personally reviewed the CNS images as listed.  My interpretation, in the context of the patient's clinical presentation, is stable disease  MR Brain W Wo Contrast  Result Date: 04/05/2023 CLINICAL DATA:  Assess treatment response.  Follow-up meningioma EXAM: MRI HEAD WITHOUT AND WITH CONTRAST TECHNIQUE: Multiplanar, multiecho pulse sequences of the brain and surrounding structures were obtained without and with intravenous contrast. CONTRAST:  7 cc of vueway intravenous COMPARISON:  Brain MRI 01/30/2022 and 09/10/2022 FINDINGS: Brain: Enhancing T2 hypointense mass centered at the right posterior clinoid and posterior/superior cavernous sinus with bulging towards the prepontine cistern. The mass measures up to 12 mm on axial images and is unchanged in size and extent. No new lesion, infarct, hydrocephalus, or collection. Small FLAIR hyperintensities in the cerebral white matter from nonspecific remote insult, non progressed. Vascular: Normal flow voids and vascular enhancements Skull and upper cervical spine: Normal marrow signal. Sinuses/Orbits: Negative. IMPRESSION: Unchanged 12 mm meningioma at the right  posterior clinoid and cavernous sinus. Electronically Signed   By: Tiburcio Pea M.D.   On: 04/05/2023 22:44      Assessment/Plan Meningioma [facility])  We appreciate the opportunity to participate in the care of Janice Burns.  She is clinically stable today, now 6 months s/p SRS for skull base meningioma.  MRI brain demonstrates stable findings.  Screening for potential clinical trials was performed and discussed using eligibility criteria for active protocols at [facility], loco-regional tertiary centers, as well as national database available on GroundTransfer[employer].    The patient is not a candidate for a research protocol at this time due to stable disease.   We spent twenty additional minutes teaching regarding the natural history, biology, and historical experience in the treatment of brain tumors. We then discussed in detail the current recommendations for therapy focusing on the mode of administration, mechanism of action, anticipated toxicities, and quality of life issues associated with this plan. We also provided teaching sheets for the patient to take home as an additional resource.  We ask that Janice Burns return to clinic in 6 months following next brain MRI, or sooner as needed.  All questions were answered. The patient knows to call the clinic with any problems, questions or concerns. No barriers to learning were detected.  The total time spent in the encounter was 45 minutes and more than 50% was on counseling and review  of test results   Henreitta Leber, MD Medical Director of Neuro-Oncology [facility] at [facility] 04/06/23 12:04 PM

## 2023-05-12 ENCOUNTER — Ambulatory Visit: Payer: No Typology Code available for payment source | Admitting: Podiatry

## 2023-05-25 ENCOUNTER — Ambulatory Visit: Payer: No Typology Code available for payment source

## 2023-05-25 ENCOUNTER — Ambulatory Visit: Payer: No Typology Code available for payment source | Admitting: Podiatry

## 2023-05-25 DIAGNOSIS — M25572 Pain in left ankle and joints of left foot: Secondary | ICD-10-CM

## 2023-05-25 DIAGNOSIS — G5792 Unspecified mononeuropathy of left lower limb: Secondary | ICD-10-CM | POA: Diagnosis not present

## 2023-05-25 DIAGNOSIS — M21622 Bunionette of left foot: Secondary | ICD-10-CM

## 2023-05-25 NOTE — Progress Notes (Signed)
       Chief Complaint  Patient presents with   Cyst    Left foot cyst and left foot pain on going since June     HPI: 57 y.o. female presenting today with concern of pain on the dorsal aspect of the left midfoot.  Denies trauma.  States that she was helping her daughter move recently and this is when the pain flared up for her.  States that sometimes the area looks swollen like a cyst and other times it is not swollen.  She was unsure if it could possibly be gout but it did get better prior to today's visit.  No past medical history on file.  No past surgical history on file.  No Known Allergies   Physical Exam:  General: The patient is alert and oriented x3 in no acute distress.  Dermatology: Skin is warm, dry and supple bilateral lower extremities. Interspaces are clear of maceration and debris.    Vascular: Palpable pedal pulses bilaterally. Capillary refill within normal limits.  No appreciable edema.  No erythema or calor.  Neurological: Light touch sensation grossly intact bilateral feet.   Musculoskeletal Exam: Positive Tinel's sign with percussion of the deep peroneal nerve on the dorsal aspect of the left midfoot in the area of her symptoms.  Minimal pain on palpation of the second metatarsal-intermediate cuneiform joint dorsally.  Minimal localized edema to the second metatarsal-intermediate cuneiform joint.  No erythema is noted.  No calor is noted no ecchymosis is seen.  Mild bony prominence on the dorsal lateral aspect of the left fifth metatarsal head consistent with tailor's bunion.  There is medial angulation of the fifth toe at the level of the MPJ  Radiographic Exam (left foot, 3 weightbearing views, 05/25/2023):  Normal osseous mineralization.  Mild uneven joint space narrowing at the first metatarsal phalangeal joint.  Mild increase in fourth intermetatarsal angle with some enlargement of the dorsal lateral aspect of the fifth metatarsal head.  There is some slight  enlargement of the bone at the articulation of the second metatarsal-intermediate cuneiform joint in the area of symptoms.  No fracture is seen.  Assessment/Plan of Care: 1. Arthralgia of left foot   2. Neuritis of left foot   3. Tailor's bunion of left foot     DG FOOT COMPLETE LEFT  Discussed clinical findings with patient today.  Discussed conservative treatment options to include oral anti-inflammatory course, corticosteroid injection, Voltaren gel twice daily, and orthotics.  Also discussed shoe gear that could aggravate her symptoms as well as alternative lacing patterns for lace-tie shoes.  Follow-up as needed   Clerance Lav, DPM, FACFAS [facility]     [address] [place], [place] [address]                Office [phone]  Fax [phone]

## 2023-09-28 ENCOUNTER — Encounter: Payer: Self-pay | Admitting: Internal Medicine

## 2023-09-29 ENCOUNTER — Encounter: Payer: Self-pay | Admitting: Internal Medicine

## 2023-10-05 ENCOUNTER — Telehealth: Payer: Self-pay | Admitting: *Deleted

## 2023-10-05 NOTE — Telephone Encounter (Signed)
Attempted to reach patent to reschedule MD visit that is scheduled for before the MRI.  Need to reschedule to after MRI is completed.  Left message pending call back.

## 2023-10-08 ENCOUNTER — Other Ambulatory Visit: Payer: No Typology Code available for payment source

## 2023-10-12 ENCOUNTER — Ambulatory Visit: Payer: No Typology Code available for payment source | Admitting: Internal Medicine

## 2023-10-23 ENCOUNTER — Ambulatory Visit
Admission: RE | Admit: 2023-10-23 | Discharge: 2023-10-23 | Disposition: A | Discharge status: Home / Self Care | Payer: No Typology Code available for payment source | Source: Ambulatory Visit | Attending: Internal Medicine | Admitting: Internal Medicine

## 2023-10-23 DIAGNOSIS — D329 Benign neoplasm of meninges, unspecified: Secondary | ICD-10-CM

## 2023-10-23 MED ORDER — GADOPICLENOL 0.5 MMOL/ML IV SOLN
7.0000 mL | Freq: Once | INTRAVENOUS | Status: AC | PRN
  Administered 2023-10-23: 7 mL via INTRAVENOUS

## 2023-10-27 ENCOUNTER — Inpatient Hospital Stay: Payer: No Typology Code available for payment source | Attending: Internal Medicine | Admitting: Internal Medicine

## 2023-10-27 VITALS — BP 155/90 | HR 74 | Temp 98.0°F | Resp 15 | Wt 154.4 lb

## 2023-10-27 DIAGNOSIS — D329 Benign neoplasm of meninges, unspecified: Secondary | ICD-10-CM | POA: Diagnosis not present

## 2023-10-27 DIAGNOSIS — Z79899 Other long term (current) drug therapy: Secondary | ICD-10-CM | POA: Diagnosis not present

## 2023-10-27 NOTE — Progress Notes (Signed)
[facility] at [facility] [address]  [place], [place] [address] [phone]   Interval Evaluation  Date of Service: 10/27/23 Patient Name: Janice Burns Patient MRN: [id number] Patient DOB: 05/24/1966 Provider: Henreitta Leber, MD  Identifying Statement:  Yanilet Mow is a 57 y.o. 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 Basilio Cairo  Interval History: Anise Salvo presents today for follow up.  No new or progressive neurologic symptoms.  Denies seizures, headaches.  H+P (04/06/23) Patient presents for follow up after completing radiation for meningioma in the fall.  She denies any neurologic symptoms.  No recurrence of headache which initially prompted imaging and workup.  No issues with numbness, double vision, slurred speech.  Continues to function independently.  Medications: Current Outpatient Medications on File Prior to Visit  Medication Sig Dispense Refill   atorvastatin (LIPITOR) 20 MG tablet Take 20 mg by mouth daily.     diazepam (VALIUM) 5 MG tablet Take 1 tablet 60 minutes prior to MRI; if needed, take a second tablet 30 minutes prior to MRI for claustrophobia. (Patient not taking: Reported on 04/06/2023) 4 tablet 0   estradiol-norethindrone (ACTIVELLA) 1-0.5 MG tablet Take 1 tablet by mouth daily.     LORazepam (ATIVAN) 1 MG tablet Take 1 tablet 40 minutes prior to MRI; if needed, take a second tablet 20 minutes prior for anxiety/claustrophobia. 8 tablet 0   metoprolol succinate (TOPROL-XL) 50 MG 24 hr tablet Take 50 mg by mouth daily.     No current facility-administered medications on file prior to visit.    Allergies: No Known Allergies Past Medical History: No past medical history on file. Past Surgical History: No past surgical history on file. Social History:  Social History   Socioeconomic History   Marital status: Married    Spouse name: Not on file   Number of  children: Not on file   Years of education: Not on file   Highest education level: Not on file  Occupational History   Not on file  Tobacco Use   Smoking status: Never   Smokeless tobacco: Never  Vaping Use   Vaping status: Never Used  Substance and Sexual Activity   Alcohol use: Yes    Comment: Occasional   Drug use: Not on file   Sexual activity: Yes    Birth control/protection: Pill  Other Topics Concern   Not on file  Social History Narrative   Not on file   Social Determinants of Health   Financial Resource Strain: Not on file  Food Insecurity: Not on file  Transportation Needs: Not on file  Physical Activity: Not on file  Stress: Not on file  Social Connections: Unknown (04/15/2022)   Received from [facility], [facility]   Social Network    Social Network: Not on file  Intimate Partner Violence: Unknown (03/07/2022)   Received from [facility], [facility]   HITS    Physically Hurt: Not on file    Insult or Talk Down To: Not on file    Threaten Physical Harm: Not on file    Scream or Curse: Not on file   Family History:  Family History  Problem Relation Age of Onset   Vaginal cancer Maternal Grandmother     Review of Systems: Constitutional: Doesn't report fevers, chills or abnormal weight loss Eyes: Doesn't report blurriness of vision Ears, nose, mouth, throat, and face: Doesn't report sore throat  Respiratory: Doesn't report cough, dyspnea or wheezes Cardiovascular: Doesn't report palpitation, chest discomfort  Gastrointestinal:  Doesn't report nausea, constipation, diarrhea GU: Doesn't report incontinence Skin: Doesn't report skin rashes Neurological: Per HPI Musculoskeletal: Doesn't report joint pain Behavioral/Psych: Doesn't report anxiety  Physical Exam: There were no vitals filed for this visit.  KPS: 90. General: Alert, cooperative, pleasant, in no acute distress Head: Normal EENT: No conjunctival injection or scleral icterus.   Lungs: Resp effort normal Cardiac: Regular rate Abdomen: Non-distended abdomen Skin: No rashes cyanosis or petechiae. Extremities: No clubbing or edema  Neurologic Exam: Mental Status: Awake, alert, attentive to examiner. Oriented to self and environment. Language is fluent with intact comprehension.  Cranial Nerves: Visual acuity is grossly normal. Visual fields are full. Extra-ocular movements intact. No ptosis. Face is symmetric Motor: Tone and bulk are normal. Power is full in both arms and legs. Reflexes are symmetric, no pathologic reflexes present.  Sensory: Intact to light touch Gait: Normal.   Labs: I have reviewed the data as listed    Component Value Date/Time   BUN 14 09/05/2022 0858   CREATININE 0.69 09/05/2022 0858   GFRNONAA >60 09/05/2022 0858   No results found for: "WBC", "NEUTROABS", "HGB", "HCT", "MCV", "PLT"  Imaging: [facility] Clinician Interpretation: I have personally reviewed the CNS images as listed.  My interpretation, in the context of the patient's clinical presentation, is stable disease  MR BRAIN W WO CONTRAST  Result Date: 10/23/2023 CLINICAL DATA:  Brain/CNS neoplasm, assess treatment response EXAM: MRI HEAD WITHOUT AND WITH CONTRAST TECHNIQUE: Multiplanar, multiecho pulse sequences of the brain and surrounding structures were obtained without and with intravenous contrast. CONTRAST:  7 ml Vueway COMPARISON:  Brain MR 04/01/23 FINDINGS: Brain: Negative for an acute infarct. No hemorrhage. No hydrocephalus. No extra-axial fluid collection. No mass effect. There is a background of mild chronic microvascular ischemic change. Redemonstrated is a 9 x 10 mm contrast enhancing lesion along the right petroclival region (series 111, image 12) Vascular: Normal flow voids. Skull and upper cervical spine: Normal marrow signal. Sinuses/Orbits: No middle ear or mastoid effusion. Paranasal sinuses are clear. Orbits are unremarkable. Other: None. 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 Electronically Signed   By: Lorenza Cambridge M.D.   On: 10/23/2023 17:02      Assessment/Plan Meningioma [facility])  We appreciate the opportunity to participate in the care of Cassidi Nold.  She is clinically stable today, now ~1 year s/p SRS for skull base meningioma.  MRI brain again demonstrates stable findings.  We ask that Charliee Briegel return to clinic in 12 months following next brain MRI, or sooner as needed.  All questions were answered. The patient knows to call the clinic with any problems, questions or concerns. No barriers to learning were detected.  The total time spent in the encounter was 30 minutes and more than 50% was on counseling and review of test results   Henreitta Leber, MD Medical Director of Neuro-Oncology [facility] at [facility] 10/27/23 10:33 AM

## 2024-01-08 DIAGNOSIS — Z01411 Encounter for gynecological examination (general) (routine) with abnormal findings: Secondary | ICD-10-CM | POA: Diagnosis not present

## 2024-01-08 DIAGNOSIS — Z124 Encounter for screening for malignant neoplasm of cervix: Secondary | ICD-10-CM | POA: Diagnosis not present

## 2024-01-08 DIAGNOSIS — Z1331 Encounter for screening for depression: Secondary | ICD-10-CM | POA: Diagnosis not present

## 2024-01-08 DIAGNOSIS — Z113 Encounter for screening for infections with a predominantly sexual mode of transmission: Secondary | ICD-10-CM | POA: Diagnosis not present

## 2024-01-08 DIAGNOSIS — Z01419 Encounter for gynecological examination (general) (routine) without abnormal findings: Secondary | ICD-10-CM | POA: Diagnosis not present

## 2024-01-08 DIAGNOSIS — Z1231 Encounter for screening mammogram for malignant neoplasm of breast: Secondary | ICD-10-CM | POA: Diagnosis not present

## 2024-01-13 DIAGNOSIS — Z0000 Encounter for general adult medical examination without abnormal findings: Secondary | ICD-10-CM | POA: Diagnosis not present

## 2024-01-13 DIAGNOSIS — I10 Essential (primary) hypertension: Secondary | ICD-10-CM | POA: Diagnosis not present

## 2024-01-13 DIAGNOSIS — E7800 Pure hypercholesterolemia, unspecified: Secondary | ICD-10-CM | POA: Diagnosis not present

## 2024-02-18 ENCOUNTER — Other Ambulatory Visit (HOSPITAL_COMMUNITY): Payer: Self-pay

## 2024-02-18 MED ORDER — METOPROLOL SUCCINATE ER 50 MG PO TB24
50.0000 mg | ORAL_TABLET | Freq: Every day | ORAL | 1 refills | Status: DC
  Filled 2024-02-18: qty 30, 30d supply, fill #0
  Filled 2024-03-08: qty 90, 90d supply, fill #0

## 2024-03-01 ENCOUNTER — Other Ambulatory Visit (HOSPITAL_COMMUNITY): Payer: Self-pay

## 2024-03-08 ENCOUNTER — Other Ambulatory Visit (HOSPITAL_COMMUNITY): Payer: Self-pay

## 2024-06-08 ENCOUNTER — Other Ambulatory Visit (HOSPITAL_COMMUNITY): Payer: Self-pay

## 2024-06-08 MED ORDER — METOPROLOL SUCCINATE ER 50 MG PO TB24
50.0000 mg | ORAL_TABLET | Freq: Every day | ORAL | 1 refills | Status: AC
  Filled 2024-06-08: qty 90, 90d supply, fill #0
  Filled 2024-11-17: qty 90, 90d supply, fill #1

## 2024-06-08 MED ORDER — ATORVASTATIN CALCIUM 20 MG PO TABS
20.0000 mg | ORAL_TABLET | Freq: Every day | ORAL | 1 refills | Status: AC
  Filled 2024-06-08: qty 90, 90d supply, fill #0
  Filled 2024-11-17: qty 90, 90d supply, fill #1

## 2024-06-17 ENCOUNTER — Encounter: Payer: Self-pay | Admitting: Advanced Practice Midwife

## 2024-06-30 ENCOUNTER — Telehealth: Payer: Self-pay | Admitting: *Deleted

## 2024-06-30 NOTE — Telephone Encounter (Signed)
 PC received from patient - she states she has been having HA's for the last 2-3 weeks, which is a change.  She describes them as ear-to-ear, they are mild & fleeting, lasting only a few minutes.  Some days she doesn't have any HA's and some days she has several.  She has not taken any medication for this.  She requests call back with recommendation.  Message routed to L. Ann NP

## 2024-07-04 ENCOUNTER — Telehealth: Payer: Self-pay | Admitting: *Deleted

## 2024-07-04 NOTE — Telephone Encounter (Signed)
 PC to patient, no answer, left VM - informed her of Dr Eward recommendation:  For bad headache days, can dose Ibuprofen 800mg  up to twice per day.  Should avoid dosing more than 4 days in a week.   If not improved we can eval in clinic.  No need for MRI.  Instructed patient to contact this office if no improvement in HA's so we can schedule appointment, [phone].

## 2024-07-07 DIAGNOSIS — D485 Neoplasm of uncertain behavior of skin: Secondary | ICD-10-CM | POA: Diagnosis not present

## 2024-07-07 DIAGNOSIS — L821 Other seborrheic keratosis: Secondary | ICD-10-CM | POA: Diagnosis not present

## 2024-07-07 DIAGNOSIS — B079 Viral wart, unspecified: Secondary | ICD-10-CM | POA: Diagnosis not present

## 2024-07-18 NOTE — Progress Notes (Signed)
 Medical Nutrition Therapy  Appointment Start time:  [phone]  Appointment End time:  1738  Primary concerns today: healthy habits and improving cholesterol  Referral diagnosis: employee visit 1 Preferred learning style: no preference indicated Learning readiness:  not ready, contemplating, ready, change in progress   NUTRITION ASSESSMENT   Clinical Medical Hx: No past medical history on file.  Medications:  Current Outpatient Medications:    atorvastatin  (LIPITOR) 20 MG tablet, Take 1 tablet (20 mg total) by mouth daily., Disp: 90 tablet, Rfl: 1   estradiol-norethindrone (ACTIVELLA) 1-0.5 MG tablet, Take 1 tablet by mouth daily., Disp: , Rfl:    metoprolol  succinate (TOPROL -XL) 50 MG 24 hr tablet, Take 1 tablet (50 mg total) by mouth daily for high blood pressure., Disp: 90 tablet, Rfl: 1   atorvastatin  (LIPITOR) 20 MG tablet, Take 20 mg by mouth daily., Disp: , Rfl:    diazepam  (VALIUM ) 5 MG tablet, Take 1 tablet 60 minutes prior to MRI; if needed, take a second tablet 30 minutes prior to MRI for claustrophobia. (Patient not taking: Reported on 04/06/2023), Disp: 4 tablet, Rfl: 0   LORazepam  (ATIVAN ) 1 MG tablet, Take 1 tablet 40 minutes prior to MRI; if needed, take a second tablet 20 minutes prior for anxiety/claustrophobia. (Patient not taking: Reported on 10/27/2023), Disp: 8 tablet, Rfl: 0   metoprolol  succinate (TOPROL -XL) 50 MG 24 hr tablet, Take 50 mg by mouth daily., Disp: , Rfl:    metoprolol  succinate (TOPROL -XL) 50 MG 24 hr tablet, Take 1 tablet (50 mg total) by mouth daily for high blood pressure., Disp: 90 tablet, Rfl: 1  Labs:  Cholesterol 166 <200 mg/dL    CHOL/HDL 3.2 7.9-5.9 Ratio    HDLD 51 30-85 mg/dL Values below 40 mg/dL indicate increased risk factor  Triglyceride 68 0-199 mg/dL    NHDL 884 9-870 mg/dL Range dependent upon risk factors.   Notable Signs/Symptoms:  BP Readings from Last 3 Encounters:  10/27/23 (!) 155/90  04/06/23 (!) 158/79  10/31/22 137/81   No  results found for: CHOL, HDL, LDLCALC, LDLDIRECT, TRIG, CHOLHDL  Lifestyle & Dietary Hx Pt present today alone. Pt reports a desire to improve her cholesterol. Pt reports she lives with her husband and 2 children. Pt reports shared cooking and shopping with her spouse. Pt reports selecting fried foods less often, switching to almond tortilla, less added salt and increasing physical activity. Pt reports she follows a NAS meal pattern. Pt reports eating out times weekly. Pt reports she does not eat on a schedule. Pt reports she walks in a club, with her dog and/or family. All Pt's questions were answered during this encounter.   Estimated daily fluid intake: unknown Supplements: MVI Sleep: 6-8 hours nightly  First Meal: skips 3/d/w or Stress / self-care: 5 out of 10 / self care includes: walking Current average weekly physical activity: walks 4-5/d/w for 60 minutes    24-Hr Dietary Recall Breakfast skips 3/d/w coffee with flavored creamer, collagen peptides, water or 2 eggs or eggs, whole wheat toast, small bowl of berries or cottage cheese or greek plain yogurt with fruit Snack: peanut butter or fruit or nuts or date  Second Meal: salad with chicken sausage or leftovers, water  Snack: none Third Meal: noodles, spinach or broccoli, sausage or chicken, sauce or 1 tortilla or chips, chicken or ground beef, rice, beans, water Snack: peanut butter or fruit or nuts or date  Beverages: water, coffee with flavored creamer, collagen peptides, coke zero  NUTRITION DIAGNOSIS  NB-1.1  Food and nutrition-related knowledge deficit As related to no prior nutrition related education.  As evidenced by Pt's reports and dietary recall.  NUTRITION INTERVENTION  Nutrition education (E-1) on the following topics:  Fruits & Vegetables: Aim to fill half your plate with a variety of fruits and vegetables. They are rich in vitamins, minerals, and fiber, and can help reduce the risk of chronic diseases.  Choose a colorful assortment of fruits and vegetables to ensure you get a wide range of nutrients. Grains and Starches: Make at least half of your grain choices whole grains, such as brown rice, whole wheat bread, and oats. Whole grains provide fiber, which aids in digestion and healthy cholesterol levels. Aim for whole forms of starchy vegetables such as potatoes, sweet potatoes, beans, peas, and corn, which are fiber rich and provide many vitamins and minerals.  Protein: Incorporate lean sources of protein, such as poultry, fish, beans, nuts, and seeds, into your meals. Protein is essential for building and repairing tissues, staying full, balancing blood sugar, as well as supporting immune function. Dairy: Include low-fat or fat-free dairy products like milk, yogurt, and cheese in your diet. Dairy foods are excellent sources of calcium  and vitamin D, which are crucial for bone health.  Physical Activity: Aim for 60 minutes of physical activity daily. Regular physical activity promotes overall health-including helping to reduce risk for heart disease and diabetes, promoting mental health, and helping us  sleep better.   Handouts Provided Include  Plate Planner-[employer] Heart Healthy with Label- AND  Learning Style & Readiness for Change Teaching method utilized: Visual & Auditory  Demonstrated degree of understanding via: Teach Back  Barriers to learning/adherence to lifestyle change: unknown  Goals Established by Pt Aim for decreasing saturated fat using nutrition labels  MONITORING & EVALUATION Dietary intake, weekly physical activity  Next Steps  Patient is to return: 09/28/2024  No questionnaires on file.

## 2024-07-27 ENCOUNTER — Encounter: Payer: Self-pay | Attending: Physician Assistant | Admitting: Dietician

## 2024-07-27 DIAGNOSIS — R030 Elevated blood-pressure reading, without diagnosis of hypertension: Secondary | ICD-10-CM | POA: Insufficient documentation

## 2024-07-27 NOTE — Patient Instructions (Signed)
 Aim for decreasing saturated fat using nutrition labels

## 2024-08-04 ENCOUNTER — Other Ambulatory Visit: Payer: Self-pay | Admitting: *Deleted

## 2024-08-04 DIAGNOSIS — D329 Benign neoplasm of meninges, unspecified: Secondary | ICD-10-CM

## 2024-09-16 ENCOUNTER — Other Ambulatory Visit (HOSPITAL_COMMUNITY): Payer: Self-pay

## 2024-09-21 NOTE — Progress Notes (Deleted)
 Medical Nutrition Therapy  Appointment Start time:  ***  Appointment End time:  ***  Primary concerns today: healthy habits and improving cholesterol  Referral diagnosis: employee visit 1 *** Preferred learning style: no preference indicated Learning readiness:  ready-change in progress   NUTRITION ASSESSMENT   Clinical Medical Hx: No past medical history on file.  Medications:  Current Outpatient Medications:    atorvastatin  (LIPITOR) 20 MG tablet, Take 20 mg by mouth daily., Disp: , Rfl:    atorvastatin  (LIPITOR) 20 MG tablet, Take 1 tablet (20 mg total) by mouth daily., Disp: 90 tablet, Rfl: 1   diazepam  (VALIUM ) 5 MG tablet, Take 1 tablet 60 minutes prior to MRI; if needed, take a second tablet 30 minutes prior to MRI for claustrophobia. (Patient not taking: Reported on 04/06/2023), Disp: 4 tablet, Rfl: 0   estradiol-norethindrone (ACTIVELLA) 1-0.5 MG tablet, Take 1 tablet by mouth daily., Disp: , Rfl:    LORazepam  (ATIVAN ) 1 MG tablet, Take 1 tablet 40 minutes prior to MRI; if needed, take a second tablet 20 minutes prior for anxiety/claustrophobia. (Patient not taking: Reported on 10/27/2023), Disp: 8 tablet, Rfl: 0   metoprolol  succinate (TOPROL -XL) 50 MG 24 hr tablet, Take 50 mg by mouth daily., Disp: , Rfl:    metoprolol  succinate (TOPROL -XL) 50 MG 24 hr tablet, Take 1 tablet (50 mg total) by mouth daily for high blood pressure., Disp: 90 tablet, Rfl: 1   metoprolol  succinate (TOPROL -XL) 50 MG 24 hr tablet, Take 1 tablet (50 mg total) by mouth daily for high blood pressure., Disp: 90 tablet, Rfl: 1  Labs:  No results found for: CHOL, HDL, LDLCALC, LDLDIRECT, TRIG, CHOLHDL   Notable Signs/Symptoms:  BP Readings from Last 3 Encounters:  10/27/23 (!) 155/90  04/06/23 (!) 158/79  10/31/22 137/81     Lifestyle & Dietary Hx  *** Pt present today alone. Pt reports a desire to improve her cholesterol. Pt reports she lives with her husband and 2 children. Pt reports  shared cooking and shopping with her spouse. Pt reports selecting fried foods less often, switching to almond tortilla, less added salt and increasing physical activity. Pt reports she follows a NAS meal pattern. Pt reports eating out *** times weekly. Pt reports she does not eat on a schedule. Pt reports she walks in a club, with her dog and/or family. All Pt's questions were answered during this encounter.  *** Estimated daily fluid intake: unknown Supplements: MVI Sleep: 6-8 hours nightly  First Meal: skips 3/d/w or Stress / self-care: 5 out of 10 / self care includes: walking Current average weekly physical activity: walks 4-5/d/w for 60 minutes   *** 24-Hr Dietary Recall Breakfast skips 3/d/w coffee with flavored creamer, collagen peptides, water or 2 eggs or eggs, whole wheat toast, small bowl of berries or cottage cheese or greek plain yogurt with fruit Snack: peanut butter or fruit or nuts or date  Second Meal: salad with chicken sausage or leftovers, water  Snack: none Third Meal: noodles, spinach or broccoli, sausage or chicken, sauce or 1 tortilla or chips, chicken or ground beef, rice, beans, water Snack: peanut butter or fruit or nuts or date  Beverages: water, coffee with flavored creamer, collagen peptides, coke zero  NUTRITION DIAGNOSIS  NB-1.1 Food and nutrition-related knowledge deficit As related to no prior nutrition related education.  As evidenced by Pt's reports and dietary recall.  NUTRITION INTERVENTION  Nutrition education (E-1) on the following topics:  Fruits & Vegetables: Aim to fill half your  plate with a variety of fruits and vegetables. They are rich in vitamins, minerals, and fiber, and can help reduce the risk of chronic diseases. Choose a colorful assortment of fruits and vegetables to ensure you get a wide range of nutrients. Grains and Starches: Make at least half of your grain choices whole grains, such as brown rice, whole wheat bread, and oats. Whole  grains provide fiber, which aids in digestion and healthy cholesterol levels. Aim for whole forms of starchy vegetables such as potatoes, sweet potatoes, beans, peas, and corn, which are fiber rich and provide many vitamins and minerals.  Protein: Incorporate lean sources of protein, such as poultry, fish, beans, nuts, and seeds, into your meals. Protein is essential for building and repairing tissues, staying full, balancing blood sugar, as well as supporting immune function. Dairy: Include low-fat or fat-free dairy products like milk, yogurt, and cheese in your diet. Dairy foods are excellent sources of calcium  and vitamin D, which are crucial for bone health.  Physical Activity: Aim for 60 minutes of physical activity daily. Regular physical activity promotes overall health-including helping to reduce risk for heart disease and diabetes, promoting mental health, and helping us  sleep better.   *** Handouts Provided Include  Plate Planner-[employer] Healthy with Label- AND  Learning Style & Readiness for Change Teaching method utilized: Visual & Auditory  Demonstrated degree of understanding via: Teach Back  Barriers to learning/adherence to lifestyle change: unknown  Goals Established by Pt Aim for decreasing saturated fat using nutrition labels ***  MONITORING & EVALUATION Dietary intake, weekly physical activity  Next Steps  Patient is to return: 09/28/2024 ***

## 2024-09-28 ENCOUNTER — Ambulatory Visit: Payer: Self-pay | Admitting: Dietician

## 2024-09-28 DIAGNOSIS — R030 Elevated blood-pressure reading, without diagnosis of hypertension: Secondary | ICD-10-CM

## 2024-10-20 ENCOUNTER — Ambulatory Visit
Admission: RE | Admit: 2024-10-20 | Discharge: 2024-10-20 | Disposition: A | Discharge status: Home / Self Care | Payer: Self-pay | Source: Ambulatory Visit | Attending: Internal Medicine | Admitting: Internal Medicine

## 2024-10-20 DIAGNOSIS — D320 Benign neoplasm of cerebral meninges: Secondary | ICD-10-CM | POA: Diagnosis not present

## 2024-10-20 DIAGNOSIS — D329 Benign neoplasm of meninges, unspecified: Secondary | ICD-10-CM

## 2024-10-20 MED ORDER — GADOPICLENOL 0.5 MMOL/ML IV SOLN
7.5000 mL | Freq: Once | INTRAVENOUS | Status: AC | PRN
  Administered 2024-10-20: 7.5 mL via INTRAVENOUS

## 2024-10-24 ENCOUNTER — Telehealth: Payer: Self-pay | Admitting: *Deleted

## 2024-10-24 NOTE — Telephone Encounter (Signed)
 Chart opened in error

## 2024-10-25 ENCOUNTER — Inpatient Hospital Stay: Payer: No Typology Code available for payment source | Attending: Internal Medicine | Admitting: Internal Medicine

## 2024-10-25 ENCOUNTER — Inpatient Hospital Stay: Payer: No Typology Code available for payment source

## 2024-10-25 VITALS — BP 148/87 | HR 76 | Temp 97.7°F | Resp 20 | Wt 162.3 lb

## 2024-10-25 DIAGNOSIS — D329 Benign neoplasm of meninges, unspecified: Secondary | ICD-10-CM | POA: Diagnosis not present

## 2024-10-25 NOTE — Progress Notes (Signed)
 [facility] [address]  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 Izell  Interval History: Matteson Strang presents today for follow up.  Denies any clinical changes today.  Denies seizures, headaches.  H+P (04/06/23) Patient presents for follow up after completing radiation for meningioma in the fall.  She denies any neurologic symptoms.  No recurrence of headache which initially prompted imaging and workup.  No issues with numbness, double vision, slurred speech.  Continues to function independently.  Medications: Current Outpatient Medications on File Prior to Visit  Medication Sig Dispense Refill   atorvastatin  (LIPITOR) 20 MG tablet Take 20 mg by mouth daily.     atorvastatin  (LIPITOR) 20 MG tablet Take 1 tablet (20 mg total) by mouth daily. 90 tablet 1   diazepam  (VALIUM ) 5 MG tablet Take 1 tablet 60 minutes prior to MRI; if needed, take a second tablet 30 minutes prior to MRI for claustrophobia. (Patient not taking: Reported on 04/06/2023) 4 tablet 0   estradiol-norethindrone (ACTIVELLA) 1-0.5 MG tablet Take 1 tablet by mouth daily.     LORazepam  (ATIVAN ) 1 MG tablet Take 1 tablet 40 minutes prior to MRI; if needed, take a second tablet 20 minutes prior for anxiety/claustrophobia. (Patient not taking: Reported on 10/27/2023) 8 tablet 0   metoprolol  succinate (TOPROL -XL) 50 MG 24 hr tablet Take 50 mg by mouth daily.     metoprolol  succinate (TOPROL -XL) 50 MG 24 hr tablet Take 1 tablet (50 mg total) by mouth daily for high blood pressure. 90 tablet 1   metoprolol  succinate (TOPROL -XL) 50 MG 24 hr tablet Take 1 tablet (50 mg total) by  mouth daily for high blood pressure. 90 tablet 1   No current facility-administered medications on file prior to visit.    Allergies: No Known Allergies Past Medical History: No past medical history on file. Past Surgical History: No past surgical history on file. Social History:  Social History   Socioeconomic History   Marital status: Married    Spouse name: Not on file   Number of children: Not on file   Years of education: Not on file   Highest education level: Not on file  Occupational History   Not on file  Tobacco Use   Smoking status: Never   Smokeless tobacco: Never  Vaping Use   Vaping status: Never Used  Substance and Sexual Activity   Alcohol use: Yes    Comment: Occasional   Drug use: Not on file   Sexual activity: Yes    Birth control/protection: Pill  Other Topics Concern   Not on file  Social History Narrative   Not on file   Social Drivers of Health   Financial Resource Strain: Not on file  Food Insecurity: No Food Insecurity (07/27/2024)   Hunger Vital Sign    Worried About Running Out of Food in the Last Year: Never true    Ran Out of Food in the Last Year: Never true  Transportation Needs: Not on file  Physical Activity: Not on file  Stress: Not on file  Social Connections: Unknown (04/15/2022)   [facility]   Social Network    Social Network: Not on  file  Intimate Partner Violence: Unknown (03/07/2022)   Received from [facility]   HITS    Physically Hurt: Not on file    Insult or Talk Down To: Not on file    Threaten Physical Harm: Not on file    Scream or Curse: Not on file   Family History:  Family History  Problem Relation Age of Onset   Vaginal cancer Maternal Grandmother     Review of Systems: Constitutional: Doesn't report fevers, chills or abnormal weight loss Eyes: Doesn't report blurriness of vision Ears, nose, mouth, throat, and face: Doesn't report sore throat Respiratory: Doesn't report cough, dyspnea  or wheezes Cardiovascular: Doesn't report palpitation, chest discomfort  Gastrointestinal:  Doesn't report nausea, constipation, diarrhea GU: Doesn't report incontinence Skin: Doesn't report skin rashes Neurological: Per HPI Musculoskeletal: Doesn't report joint pain Behavioral/Psych: Doesn't report anxiety  Physical Exam: There were no vitals filed for this visit.  KPS: 90. General: Alert, cooperative, pleasant, in no acute distress Head: Normal EENT: No conjunctival injection or scleral icterus.  Lungs: Resp effort normal Cardiac: Regular rate Abdomen: Non-distended abdomen Skin: No rashes cyanosis or petechiae. Extremities: No clubbing or edema  Neurologic Exam: Mental Status: Awake, alert, attentive to examiner. Oriented to self and environment. Language is fluent with intact comprehension.  Cranial Nerves: Visual acuity is grossly normal. Visual fields are full. Extra-ocular movements intact. No ptosis. Face is symmetric Motor: Tone and bulk are normal. Power is full in both arms and legs. Reflexes are symmetric, no pathologic reflexes present.  Sensory: Intact to light touch Gait: Normal.   Labs: I have reviewed the data as listed    Component Value Date/Time   BUN 14 09/05/2022 0858   CREATININE 0.69 09/05/2022 0858   GFRNONAA >60 09/05/2022 0858   No results found for: WBC, NEUTROABS, HGB, HCT, MCV, PLT  Imaging: [facility] Clinician Interpretation: I have personally reviewed the CNS images as listed.  My interpretation, in the context of the patient's clinical presentation, is stable disease  MR Brain W Wo Contrast Result Date: 10/20/2024 EXAM: MRI BRAIN WITH AND WITHOUT CONTRAST 10/20/2024 03:55:22 PM TECHNIQUE: Multiplanar multisequence MRI of the head/brain was performed with and without the administration of intravenous contrast. COMPARISON: Head MRI 10/23/2023. CLINICAL HISTORY: Brain/CNS neoplasm, monitor. Meningioma. FINDINGS: BRAIN AND VENTRICLES:  There is no evidence of an acute infarct, intracranial hemorrhage, midline shift, hydrocephalus, or extra-axial fluid collection. Cerebral volume is normal. Minimal scattered foci of T2 FLAIR hyperintensity/nonspecific gliosis in the bifrontal white matter are unchanged. A 12 x 8 x 9 mm homogeneously enhancing extra-axial mass along the posterior right clinoid process bulging into the posterior aspect of the right cavernous sinus as well as mildly bulging into the prepontine cistern is unchanged. Major intracranial vascular flow voids are preserved. ORBITS: No acute abnormality. SINUSES: No acute abnormality. BONES AND SOFT TISSUES: Normal bone marrow signal and enhancement. No acute soft tissue abnormality. IMPRESSION: 1. 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 Electronically signed by: Dasie Hamburg MD 10/20/2024 04:52 PM EST RP Workstation: [id number]      Assessment/Plan Meningioma [facility])  We appreciate the opportunity to participate in the care of Misa Milliman.  She is clinically stable today, now 2 years s/p SRS for skull base meningioma.  MRI brain continues to demonstrate stable findings.  We ask that Darrelle Game return to clinic in 12 months following next brain MRI, or sooner as needed.  All questions were answered. The patient knows to call the clinic with any problems, questions  or concerns. No barriers to learning were detected.  The total time spent in the encounter was 30 minutes and more than 50% was on counseling and review of test results   Arthea MARLA Manns, MD Medical Director of Neuro-Oncology [facility] 10/25/24 10:33 AM

## 2024-11-17 ENCOUNTER — Other Ambulatory Visit (HOSPITAL_COMMUNITY): Payer: Self-pay

## 2024-11-17 MED FILL — Estradiol & Norethindrone Acetate Tab 1-0.5 MG: 1.0000 | ORAL | 84 days supply | Qty: 84 | Fill #0 | Status: AC

## 2024-11-18 ENCOUNTER — Other Ambulatory Visit (HOSPITAL_COMMUNITY): Payer: Self-pay

## 2025-10-30 ENCOUNTER — Inpatient Hospital Stay: Admitting: Internal Medicine
